# Patient Record
Sex: Male | Born: 2000
Health system: Southern US, Community
[De-identification: ages and names within clinical notes are randomized; demographics above are authoritative.]

## PROBLEM LIST (undated history)

## (undated) DIAGNOSIS — R569 Unspecified convulsions: Secondary | ICD-10-CM

## (undated) DIAGNOSIS — F419 Anxiety disorder, unspecified: Secondary | ICD-10-CM

## (undated) HISTORY — DX: Unspecified convulsions: R56.9

## (undated) HISTORY — DX: Anxiety disorder, unspecified: F41.9

## (undated) HISTORY — PX: TONSILLECTOMY: SUR1361

## (undated) HISTORY — PX: ADENOIDECTOMY: SUR15

---
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
## 2018-03-08 ENCOUNTER — Other Ambulatory Visit: Payer: Self-pay | Admitting: Family Medicine

## 2018-03-08 ENCOUNTER — Encounter: Payer: Self-pay | Admitting: Family Medicine

## 2018-03-23 ENCOUNTER — Ambulatory Visit: Payer: BLUE CROSS/BLUE SHIELD | Admitting: Family Medicine

## 2018-04-03 ENCOUNTER — Ambulatory Visit (INDEPENDENT_AMBULATORY_CARE_PROVIDER_SITE_OTHER): Payer: BLUE CROSS/BLUE SHIELD | Admitting: Family Medicine

## 2018-04-03 ENCOUNTER — Encounter: Payer: Self-pay | Admitting: Family Medicine

## 2018-04-03 VITALS — BP 130/60 | HR 74 | Temp 98.4°F | Resp 12 | Ht 72.0 in | Wt 222.0 lb

## 2018-04-03 DIAGNOSIS — Z0000 Encounter for general adult medical examination without abnormal findings: Secondary | ICD-10-CM

## 2018-04-03 DIAGNOSIS — Z7689 Persons encountering health services in other specified circumstances: Secondary | ICD-10-CM

## 2018-04-03 DIAGNOSIS — Z8669 Personal history of other diseases of the nervous system and sense organs: Secondary | ICD-10-CM

## 2018-04-03 DIAGNOSIS — Z13220 Encounter for screening for lipoid disorders: Secondary | ICD-10-CM | POA: Diagnosis not present

## 2018-04-03 LAB — COMPLETE METABOLIC PANEL WITH GFR
AG Ratio: 2.4 (calc) (ref 1.0–2.5)
ALT: 22 U/L (ref 8–46)
AST: 20 U/L (ref 12–32)
Albumin: 4.7 g/dL (ref 3.6–5.1)
Alkaline phosphatase (APISO): 120 U/L (ref 48–230)
BUN: 11 mg/dL (ref 7–20)
CALCIUM: 9.7 mg/dL (ref 8.9–10.4)
CO2: 27 mmol/L (ref 20–32)
Chloride: 105 mmol/L (ref 98–110)
Creat: 0.86 mg/dL (ref 0.60–1.26)
GFR, EST AFRICAN AMERICAN: 146 mL/min/{1.73_m2} (ref >=60)
GFR, EST NON AFRICAN AMERICAN: 126 mL/min/{1.73_m2} (ref >=60)
GLUCOSE: 107 mg/dL — AB (ref 65–99)
Globulin: 2 g/dL (calc) — ABNORMAL LOW (ref 2.1–3.5)
Potassium: 4.2 mmol/L (ref 3.8–5.1)
SODIUM: 142 mmol/L (ref 135–146)
Total Bilirubin: 0.5 mg/dL (ref 0.2–1.1)
Total Protein: 6.7 g/dL (ref 6.3–8.2)

## 2018-04-03 LAB — CBC WITH DIFFERENTIAL/PLATELET
BASOS PCT: 0.7 %
Basophils Absolute: 42 cells/uL (ref 0–200)
EOS PCT: 3.2 %
Eosinophils Absolute: 192 cells/uL (ref 15–500)
HCT: 43.9 % (ref 38.5–50.0)
Hemoglobin: 15.4 g/dL (ref 13.2–17.1)
Lymphs Abs: 1662 cells/uL (ref 850–3900)
MCH: 30.8 pg (ref 27.0–33.0)
MCHC: 35.1 g/dL (ref 32.0–36.0)
MCV: 87.8 fL (ref 80.0–100.0)
MONOS PCT: 6.1 %
MPV: 9.9 fL (ref 7.5–12.5)
Neutro Abs: 3738 cells/uL (ref 1500–7800)
Neutrophils Relative %: 62.3 %
PLATELETS: 202 10*3/uL (ref 140–400)
RBC: 5 10*6/uL (ref 4.20–5.80)
RDW: 12.6 % (ref 11.0–15.0)
TOTAL LYMPHOCYTE: 27.7 %
WBC: 6 10*3/uL (ref 3.8–10.8)
WBCMIX: 366 {cells}/uL (ref 200–950)

## 2018-04-03 LAB — LIPID PANEL
CHOL/HDL RATIO: 3 (calc) (ref <5.0)
Cholesterol: 121 mg/dL (ref <170)
HDL: 40 mg/dL — ABNORMAL LOW (ref >45)
LDL Cholesterol (Calc): 59 mg/dL (calc) (ref <110)
NON-HDL CHOLESTEROL (CALC): 81 mg/dL (ref <120)
Triglycerides: 139 mg/dL — ABNORMAL HIGH (ref <90)

## 2018-04-03 NOTE — Progress Notes (Signed)
Subjective:    Patient ID: Luis Murphy, male    DOB: 05-04-2001, 17 y.o.   MRN: [id number]  HPI Patient is a very pleasant 17 year old Caucasian male here today to establish care.  Patient has a family history of epilepsy in his father.  In 2014, he had a generalized tonic-clonic seizure.  He was initially placed on Lamictal.  Keppra was added in 2016 after he had absent seizures.  Patient states that he has been seizure-free since 2016.  He is tolerating the Lamictal and the Keppra well without any side effects.  He is followed by neurology.  Otherwise he has no major health concerns.  Reviewing his immunization records, the patient is due for the meningitis B vaccine along with Gardasil.  We discussed these today.  He declines a meningitis B vaccine however he would be interested in Gardasil at a later date.  He prefers to defer it at the present time.  It does not appear that he has had any regular screening lab work as far as I can tell in the last 12 months.  His blood pressure today is borderline elevated for age at 130/60 as is his BMI.  Therefore he is due for a cholesterol screen.  Otherwise his review of systems is negative. Past Medical History:  Diagnosis Date  . Anxiety   . Seizures (HCC)    Past Surgical History:  Procedure Laterality Date  . ADENOIDECTOMY    . TONSILLECTOMY     Current Outpatient Medications on File Prior to Visit  Medication Sig Dispense Refill  . lamoTRIgine (LAMICTAL) 150 MG tablet Take 1 tablet (150 mg total) by mouth daily.    Marland Kitchen levETIRAcetam (KEPPRA) 500 MG tablet 1 tab po qam & 1/2 tab po qhs 45 tablet    No current facility-administered medications on file prior to visit.    No Known Allergies Social History   Socioeconomic History  . Marital status: Single    Spouse name: Not on file  . Number of children: Not on file  . Years of education: Not on file  . Highest education level: Not on file  Occupational History  . Not on file  Social  Needs  . Financial resource strain: Not on file  . Food insecurity:    Worry: Not on file    Inability: Not on file  . Transportation needs:    Medical: Not on file    Non-medical: Not on file  Tobacco Use  . Smoking status: Never Smoker  . Smokeless tobacco: Never Used  Substance and Sexual Activity  . Alcohol use: No  . Drug use: No  . Sexual activity: Never  Lifestyle  . Physical activity:    Days per week: Not on file    Minutes per session: Not on file  . Stress: Not on file  Relationships  . Social connections:    Talks on phone: Not on file    Gets together: Not on file    Attends religious service: Not on file    Active member of club or organization: Not on file    Attends meetings of clubs or organizations: Not on file    Relationship status: Not on file  . Intimate partner violence:    Fear of current or ex partner: Not on file    Emotionally abused: Not on file    Physically abused: Not on file    Forced sexual activity: Not on file  Other Topics Concern  .  Not on file  Social History Narrative  . Not on file   Family History  Problem Relation Age of Onset  . Multiple sclerosis Mother   . Seizures Father   . Epilepsy Father   . Multiple sclerosis Maternal Grandmother   . COPD Maternal Grandmother   . Multiple sclerosis Paternal Grandmother   . Cancer Paternal Grandmother        breast  . Multiple sclerosis Paternal Uncle   . Seizures Paternal Uncle   . Multiple sclerosis Cousin        Maternal 1st Cousin  . Diabetes Maternal Aunt   . Diabetes Maternal Uncle       Review of Systems  All other systems reviewed and are negative.      Objective:   Physical Exam  Constitutional: He is oriented to person, place, and time. He appears well-developed and well-nourished. No distress.  HENT:  Head: Normocephalic and atraumatic.  Right Ear: External ear normal.  Left Ear: External ear normal.  Nose: Nose normal.  Mouth/Throat: Oropharynx is clear  and moist. No oropharyngeal exudate.  Eyes: Pupils are equal, round, and reactive to light. Conjunctivae and EOM are normal. Right eye exhibits no discharge. Left eye exhibits no discharge. No scleral icterus.  Neck: Normal range of motion. Neck supple. No JVD present. No tracheal deviation present. No thyromegaly present.  Cardiovascular: Normal rate, regular rhythm, normal heart sounds and intact distal pulses. Exam reveals no gallop and no friction rub.  No murmur heard. Pulmonary/Chest: Effort normal and breath sounds normal. No stridor. No respiratory distress. He has no wheezes. He has no rales. He exhibits no tenderness.  Abdominal: Soft. Bowel sounds are normal. He exhibits no distension and no mass. There is no tenderness. There is no rebound and no guarding. No hernia.  Musculoskeletal: Normal range of motion. He exhibits no edema, tenderness or deformity.  Lymphadenopathy:    He has no cervical adenopathy.  Neurological: He is alert and oriented to person, place, and time. He displays normal reflexes. No cranial nerve deficit or sensory deficit. He exhibits normal muscle tone. Coordination normal.  Skin: Skin is warm. No rash noted. He is not diaphoretic. No erythema. No pallor.  Psychiatric: He has a normal mood and affect. His behavior is normal. Judgment and thought content normal.  Vitals reviewed.         Assessment & Plan:  Screening cholesterol level - Plan: CBC with Differential/Platelet, COMPLETE METABOLIC PANEL WITH GFR, Lipid panel  History of epilepsy  Encounter to establish care with new doctor  Physical exam today is completely normal aside from a slight elevation in blood pressure as well as BMI.  I will check a CBC, CMP, fasting lipid panel.  His seizures sound very well controlled and he follows up regularly with his neurologist.  I did recommend the meningitis B as well as the Gardasil vaccine.  He politely declines meningitis B would be interested in the  Gardasil vaccine.  However he defers that at the present time to a later date.  Otherwise regular anticipatory guidance is provided.

## 2018-04-04 ENCOUNTER — Encounter: Payer: Self-pay | Admitting: Family Medicine

## 2018-04-20 ENCOUNTER — Other Ambulatory Visit: Payer: BLUE CROSS/BLUE SHIELD

## 2018-05-29 DIAGNOSIS — R42 Dizziness and giddiness: Secondary | ICD-10-CM | POA: Diagnosis not present

## 2018-05-29 DIAGNOSIS — F419 Anxiety disorder, unspecified: Secondary | ICD-10-CM | POA: Diagnosis not present

## 2018-05-29 DIAGNOSIS — R569 Unspecified convulsions: Secondary | ICD-10-CM | POA: Diagnosis not present

## 2018-12-13 DIAGNOSIS — F419 Anxiety disorder, unspecified: Secondary | ICD-10-CM | POA: Diagnosis not present

## 2018-12-13 DIAGNOSIS — R569 Unspecified convulsions: Secondary | ICD-10-CM | POA: Diagnosis not present

## 2019-03-06 ENCOUNTER — Other Ambulatory Visit: Payer: Self-pay

## 2019-03-06 ENCOUNTER — Ambulatory Visit (INDEPENDENT_AMBULATORY_CARE_PROVIDER_SITE_OTHER): Payer: BLUE CROSS/BLUE SHIELD | Admitting: Family Medicine

## 2019-03-06 DIAGNOSIS — Z20828 Contact with and (suspected) exposure to other viral communicable diseases: Secondary | ICD-10-CM | POA: Diagnosis not present

## 2019-03-06 DIAGNOSIS — Z20822 Contact with and (suspected) exposure to covid-19: Secondary | ICD-10-CM

## 2019-03-06 NOTE — Progress Notes (Signed)
Subjective:    Patient ID: Luis Murphy, male    DOB: 01/07/2001, 18 y.o.   MRN: [id number]  HPI Patient is being seen today over the telephone.  He consents to a telephone visit.  Phone call began at 1230.  Phone call ended at 1240.  Patient is currently at home.  I am currently in my office.  Patient states that he feels fine.  Saturday night, 3 days ago, there was a birthday party.  His cousin went to the birthday party.  At the birthday party was another family member who earlier in the week had been tested for coronavirus.  His symptoms had improved and he was asymptomatic at the birthday party.  The cousin was around this person Saturday night at the birthday party.  Sunday, 2 days ago, the cousin visited the patient for approximately 10 minutes.  They were within 6 feet of each other.  Sunday afternoon, the cousin was notified that the individual at the birthday party had tested positive for corona virus.  The cousin had around the patient less than 12 hours after potentially being exposed to coronavirus.  He was completely asymptomatic at the time.  Furthermore the family member who tested positive was completely asymptomatic and had recovered from his illness. Past Medical History:  Diagnosis Date  . Anxiety   . Seizures (HCC)    Past Surgical History:  Procedure Laterality Date  . ADENOIDECTOMY    . TONSILLECTOMY     Current Outpatient Medications on File Prior to Visit  Medication Sig Dispense Refill  . lamoTRIgine (LAMICTAL) 150 MG tablet Take 1 tablet (150 mg total) by mouth daily.    Marland Kitchen levETIRAcetam (KEPPRA) 500 MG tablet 1 tab po qam & 1/2 tab po qhs 45 tablet    No current facility-administered medications on file prior to visit.    No Known Allergies Social History   Socioeconomic History  . Marital status: Single    Spouse name: Not on file  . Number of children: Not on file  . Years of education: Not on file  . Highest education level: Not on file   Occupational History  . Not on file  Social Needs  . Financial resource strain: Not on file  . Food insecurity:    Worry: Not on file    Inability: Not on file  . Transportation needs:    Medical: Not on file    Non-medical: Not on file  Tobacco Use  . Smoking status: Never Smoker  . Smokeless tobacco: Never Used  Substance and Sexual Activity  . Alcohol use: No  . Drug use: No  . Sexual activity: Never  Lifestyle  . Physical activity:    Days per week: Not on file    Minutes per session: Not on file  . Stress: Not on file  Relationships  . Social connections:    Talks on phone: Not on file    Gets together: Not on file    Attends religious service: Not on file    Active member of club or organization: Not on file    Attends meetings of clubs or organizations: Not on file    Relationship status: Not on file  . Intimate partner violence:    Fear of current or ex partner: Not on file    Emotionally abused: Not on file    Physically abused: Not on file    Forced sexual activity: Not on file  Other Topics Concern  . Not  on file  Social History Narrative  . Not on file     Review of Systems  All other systems reviewed and are negative.      Objective:   Physical Exam  No physical exam was performed today      Assessment & Plan:  Exposure to 2019 Novel Coronavirus  At this point, I do not believe the patient has been directly exposed to coronavirus and therefore I do not believe the patient requires any quarantine beyond the guidelines recommended to society in general.  His cousin likely was not exposed to the virus and furthermore if he was exposed to the virus, I do not believe there would have been sufficient incubation period for him to begin shedding viral particles and spreading the virus by the time he was around the patient.  However, if the cousin begins to develop symptoms, I would recommend quarantine for the patient and his family for 14 days after  they were around the cousin.  At this point I do not feel that they need to be quarantined

## 2019-05-21 DIAGNOSIS — M546 Pain in thoracic spine: Secondary | ICD-10-CM | POA: Diagnosis not present

## 2019-05-21 DIAGNOSIS — M545 Low back pain: Secondary | ICD-10-CM | POA: Diagnosis not present

## 2019-05-21 DIAGNOSIS — M9902 Segmental and somatic dysfunction of thoracic region: Secondary | ICD-10-CM | POA: Diagnosis not present

## 2019-05-21 DIAGNOSIS — M9903 Segmental and somatic dysfunction of lumbar region: Secondary | ICD-10-CM | POA: Diagnosis not present

## 2019-05-28 DIAGNOSIS — M9902 Segmental and somatic dysfunction of thoracic region: Secondary | ICD-10-CM | POA: Diagnosis not present

## 2019-05-28 DIAGNOSIS — M9903 Segmental and somatic dysfunction of lumbar region: Secondary | ICD-10-CM | POA: Diagnosis not present

## 2019-05-28 DIAGNOSIS — M546 Pain in thoracic spine: Secondary | ICD-10-CM | POA: Diagnosis not present

## 2019-05-28 DIAGNOSIS — M545 Low back pain: Secondary | ICD-10-CM | POA: Diagnosis not present

## 2019-06-11 DIAGNOSIS — M9903 Segmental and somatic dysfunction of lumbar region: Secondary | ICD-10-CM | POA: Diagnosis not present

## 2019-06-11 DIAGNOSIS — M546 Pain in thoracic spine: Secondary | ICD-10-CM | POA: Diagnosis not present

## 2019-06-11 DIAGNOSIS — M9902 Segmental and somatic dysfunction of thoracic region: Secondary | ICD-10-CM | POA: Diagnosis not present

## 2019-06-11 DIAGNOSIS — M545 Low back pain: Secondary | ICD-10-CM | POA: Diagnosis not present

## 2019-06-18 DIAGNOSIS — R569 Unspecified convulsions: Secondary | ICD-10-CM | POA: Diagnosis not present

## 2019-06-18 DIAGNOSIS — F419 Anxiety disorder, unspecified: Secondary | ICD-10-CM | POA: Diagnosis not present

## 2019-06-27 DIAGNOSIS — M545 Low back pain: Secondary | ICD-10-CM | POA: Diagnosis not present

## 2019-06-27 DIAGNOSIS — M9902 Segmental and somatic dysfunction of thoracic region: Secondary | ICD-10-CM | POA: Diagnosis not present

## 2019-06-27 DIAGNOSIS — M546 Pain in thoracic spine: Secondary | ICD-10-CM | POA: Diagnosis not present

## 2019-06-27 DIAGNOSIS — M9903 Segmental and somatic dysfunction of lumbar region: Secondary | ICD-10-CM | POA: Diagnosis not present

## 2019-07-06 DIAGNOSIS — M545 Low back pain: Secondary | ICD-10-CM | POA: Diagnosis not present

## 2019-07-06 DIAGNOSIS — M9902 Segmental and somatic dysfunction of thoracic region: Secondary | ICD-10-CM | POA: Diagnosis not present

## 2019-07-06 DIAGNOSIS — M546 Pain in thoracic spine: Secondary | ICD-10-CM | POA: Diagnosis not present

## 2019-07-06 DIAGNOSIS — M9903 Segmental and somatic dysfunction of lumbar region: Secondary | ICD-10-CM | POA: Diagnosis not present

## 2019-10-24 DIAGNOSIS — R0981 Nasal congestion: Secondary | ICD-10-CM | POA: Diagnosis not present

## 2019-10-24 DIAGNOSIS — R519 Headache, unspecified: Secondary | ICD-10-CM | POA: Diagnosis not present

## 2019-10-24 DIAGNOSIS — Z20828 Contact with and (suspected) exposure to other viral communicable diseases: Secondary | ICD-10-CM | POA: Diagnosis not present

## 2019-10-24 DIAGNOSIS — R05 Cough: Secondary | ICD-10-CM | POA: Diagnosis not present

## 2019-11-02 ENCOUNTER — Other Ambulatory Visit: Payer: Self-pay

## 2019-11-02 DIAGNOSIS — Z20822 Contact with and (suspected) exposure to covid-19: Secondary | ICD-10-CM

## 2019-11-03 LAB — NOVEL CORONAVIRUS, NAA: SARS-CoV-2, NAA: DETECTED — AB

## 2019-11-19 ENCOUNTER — Ambulatory Visit: Payer: BC Managed Care – PPO | Attending: Internal Medicine

## 2019-11-19 ENCOUNTER — Other Ambulatory Visit: Payer: Self-pay

## 2019-11-19 DIAGNOSIS — Z20828 Contact with and (suspected) exposure to other viral communicable diseases: Secondary | ICD-10-CM | POA: Diagnosis not present

## 2019-11-19 DIAGNOSIS — Z20822 Contact with and (suspected) exposure to covid-19: Secondary | ICD-10-CM

## 2019-11-21 LAB — NOVEL CORONAVIRUS, NAA: SARS-CoV-2, NAA: NOT DETECTED

## 2020-07-08 DIAGNOSIS — Z20822 Contact with and (suspected) exposure to covid-19: Secondary | ICD-10-CM | POA: Diagnosis not present

## 2020-11-05 DIAGNOSIS — J029 Acute pharyngitis, unspecified: Secondary | ICD-10-CM | POA: Diagnosis not present

## 2020-11-14 DIAGNOSIS — J029 Acute pharyngitis, unspecified: Secondary | ICD-10-CM | POA: Diagnosis not present

## 2020-11-14 DIAGNOSIS — B9689 Other specified bacterial agents as the cause of diseases classified elsewhere: Secondary | ICD-10-CM | POA: Diagnosis not present

## 2020-11-14 DIAGNOSIS — J019 Acute sinusitis, unspecified: Secondary | ICD-10-CM | POA: Diagnosis not present

## 2021-04-27 DIAGNOSIS — M9901 Segmental and somatic dysfunction of cervical region: Secondary | ICD-10-CM | POA: Diagnosis not present

## 2021-04-28 DIAGNOSIS — M9901 Segmental and somatic dysfunction of cervical region: Secondary | ICD-10-CM | POA: Diagnosis not present

## 2021-04-29 DIAGNOSIS — M9901 Segmental and somatic dysfunction of cervical region: Secondary | ICD-10-CM | POA: Diagnosis not present

## 2021-04-30 DIAGNOSIS — M9901 Segmental and somatic dysfunction of cervical region: Secondary | ICD-10-CM | POA: Diagnosis not present

## 2021-05-04 DIAGNOSIS — M9901 Segmental and somatic dysfunction of cervical region: Secondary | ICD-10-CM | POA: Diagnosis not present

## 2021-05-06 DIAGNOSIS — M9901 Segmental and somatic dysfunction of cervical region: Secondary | ICD-10-CM | POA: Diagnosis not present

## 2021-05-07 DIAGNOSIS — M9901 Segmental and somatic dysfunction of cervical region: Secondary | ICD-10-CM | POA: Diagnosis not present

## 2021-05-11 DIAGNOSIS — M9901 Segmental and somatic dysfunction of cervical region: Secondary | ICD-10-CM | POA: Diagnosis not present

## 2021-05-13 DIAGNOSIS — M9901 Segmental and somatic dysfunction of cervical region: Secondary | ICD-10-CM | POA: Diagnosis not present

## 2021-05-14 DIAGNOSIS — M9901 Segmental and somatic dysfunction of cervical region: Secondary | ICD-10-CM | POA: Diagnosis not present

## 2021-05-18 DIAGNOSIS — M9901 Segmental and somatic dysfunction of cervical region: Secondary | ICD-10-CM | POA: Diagnosis not present

## 2021-05-21 DIAGNOSIS — M9901 Segmental and somatic dysfunction of cervical region: Secondary | ICD-10-CM | POA: Diagnosis not present

## 2021-05-26 DIAGNOSIS — M9901 Segmental and somatic dysfunction of cervical region: Secondary | ICD-10-CM | POA: Diagnosis not present

## 2021-11-06 DIAGNOSIS — R569 Unspecified convulsions: Secondary | ICD-10-CM | POA: Diagnosis not present

## 2021-11-18 DIAGNOSIS — Z79899 Other long term (current) drug therapy: Secondary | ICD-10-CM | POA: Diagnosis not present

## 2021-11-18 DIAGNOSIS — R569 Unspecified convulsions: Secondary | ICD-10-CM | POA: Diagnosis not present

## 2024-12-25 ENCOUNTER — Ambulatory Visit: Admitting: Family Medicine

## 2024-12-25 VITALS — BP 118/62 | HR 75 | Temp 98.3°F | Ht 72.0 in | Wt 179.0 lb

## 2024-12-25 DIAGNOSIS — Z13220 Encounter for screening for lipoid disorders: Secondary | ICD-10-CM

## 2024-12-25 DIAGNOSIS — G40909 Epilepsy, unspecified, not intractable, without status epilepticus: Secondary | ICD-10-CM

## 2024-12-25 DIAGNOSIS — Z0000 Encounter for general adult medical examination without abnormal findings: Secondary | ICD-10-CM

## 2024-12-25 DIAGNOSIS — R5382 Chronic fatigue, unspecified: Secondary | ICD-10-CM | POA: Diagnosis not present

## 2024-12-25 DIAGNOSIS — Z0001 Encounter for general adult medical examination with abnormal findings: Secondary | ICD-10-CM | POA: Diagnosis not present

## 2024-12-26 ENCOUNTER — Ambulatory Visit: Payer: Self-pay | Admitting: Family Medicine

## 2024-12-26 LAB — CBC WITH DIFFERENTIAL/PLATELET
Absolute Lymphocytes: 1210 {cells}/uL (ref 850–3900)
Absolute Monocytes: 250 {cells}/uL (ref 200–950)
Basophils Absolute: 29 {cells}/uL (ref 0–200)
Basophils Relative: 0.6 %
Eosinophils Absolute: 29 {cells}/uL (ref 15–500)
Eosinophils Relative: 0.6 %
HCT: 43.7 % (ref 39.4–51.1)
Hemoglobin: 15 g/dL (ref 13.2–17.1)
MCH: 31.1 pg (ref 27.0–33.0)
MCHC: 34.3 g/dL (ref 31.6–35.4)
MCV: 90.5 fL (ref 81.4–101.7)
MPV: 10.1 fL (ref 7.5–12.5)
Monocytes Relative: 5.1 %
Neutro Abs: 3381 {cells}/uL (ref 1500–7800)
Neutrophils Relative %: 69 %
Platelets: 202 10*3/uL (ref 140–400)
RBC: 4.83 Million/uL (ref 4.20–5.80)
RDW: 12.4 % (ref 11.0–15.0)
Total Lymphocyte: 24.7 %
WBC: 4.9 10*3/uL (ref 3.8–10.8)

## 2024-12-26 LAB — COMPREHENSIVE METABOLIC PANEL WITH GFR
AG Ratio: 2.7 (calc) — ABNORMAL HIGH (ref 1.0–2.5)
ALT: 11 U/L (ref 9–46)
AST: 11 U/L (ref 10–40)
Albumin: 5.1 g/dL (ref 3.6–5.1)
Alkaline phosphatase (APISO): 65 U/L (ref 36–130)
BUN: 9 mg/dL (ref 7–25)
CO2: 29 mmol/L (ref 20–32)
Calcium: 9.5 mg/dL (ref 8.6–10.3)
Chloride: 103 mmol/L (ref 98–110)
Creat: 0.95 mg/dL (ref 0.60–1.24)
Globulin: 1.9 g/dL (ref 1.9–3.7)
Glucose, Bld: 108 mg/dL — ABNORMAL HIGH (ref 65–99)
Potassium: 4.1 mmol/L (ref 3.5–5.3)
Sodium: 140 mmol/L (ref 135–146)
Total Bilirubin: 0.4 mg/dL (ref 0.2–1.2)
Total Protein: 7 g/dL (ref 6.1–8.1)
eGFR: 114 mL/min/{1.73_m2}

## 2024-12-26 LAB — LIPID PANEL
Cholesterol: 108 mg/dL
HDL: 58 mg/dL
LDL Cholesterol (Calc): 41 mg/dL
Non-HDL Cholesterol (Calc): 50 mg/dL
Total CHOL/HDL Ratio: 1.9 (calc)
Triglycerides: 32 mg/dL

## 2024-12-26 LAB — TSH: TSH: 2.3 m[IU]/L (ref 0.40–4.50)

## 2025-01-10 ENCOUNTER — Encounter: Admitting: Family Medicine

## 2025-01-30 ENCOUNTER — Ambulatory Visit: Admitting: Family Medicine

## 2025-12-26 ENCOUNTER — Encounter: Admitting: Family Medicine
